# Patient Record
Sex: Male | Born: 1985 | Race: White | Hispanic: Yes | Marital: Married | Smoking: Never smoker
Health system: Southern US, Community
[De-identification: ages and names within clinical notes are randomized; demographics above are authoritative.]

---

## 2009-07-15 ENCOUNTER — Emergency Department (HOSPITAL_COMMUNITY): Admission: EM | Admit: 2009-07-15 | Discharge: 2009-07-15 | Payer: Self-pay | Admitting: Emergency Medicine

## 2010-12-27 ENCOUNTER — Emergency Department (HOSPITAL_COMMUNITY): Payer: Self-pay | Attending: Emergency Medicine

## 2010-12-27 ENCOUNTER — Emergency Department (HOSPITAL_COMMUNITY)
Admission: EM | Admit: 2010-12-27 | Discharge: 2010-12-27 | Disposition: A | Discharge status: Home / Self Care | Payer: Self-pay | Source: Home / Self Care | Attending: Emergency Medicine | Admitting: Emergency Medicine

## 2010-12-27 DIAGNOSIS — R079 Chest pain, unspecified: Secondary | ICD-10-CM | POA: Insufficient documentation

## 2010-12-27 DIAGNOSIS — R071 Chest pain on breathing: Secondary | ICD-10-CM | POA: Insufficient documentation

## 2011-08-10 IMAGING — CR DG CHEST 2V
2 series · 2 of 2 positions shown · non-contrast
Comparison: None.

CLINICAL DATA: Chest pain

CHEST - 2 VIEW

[w chest pa *]
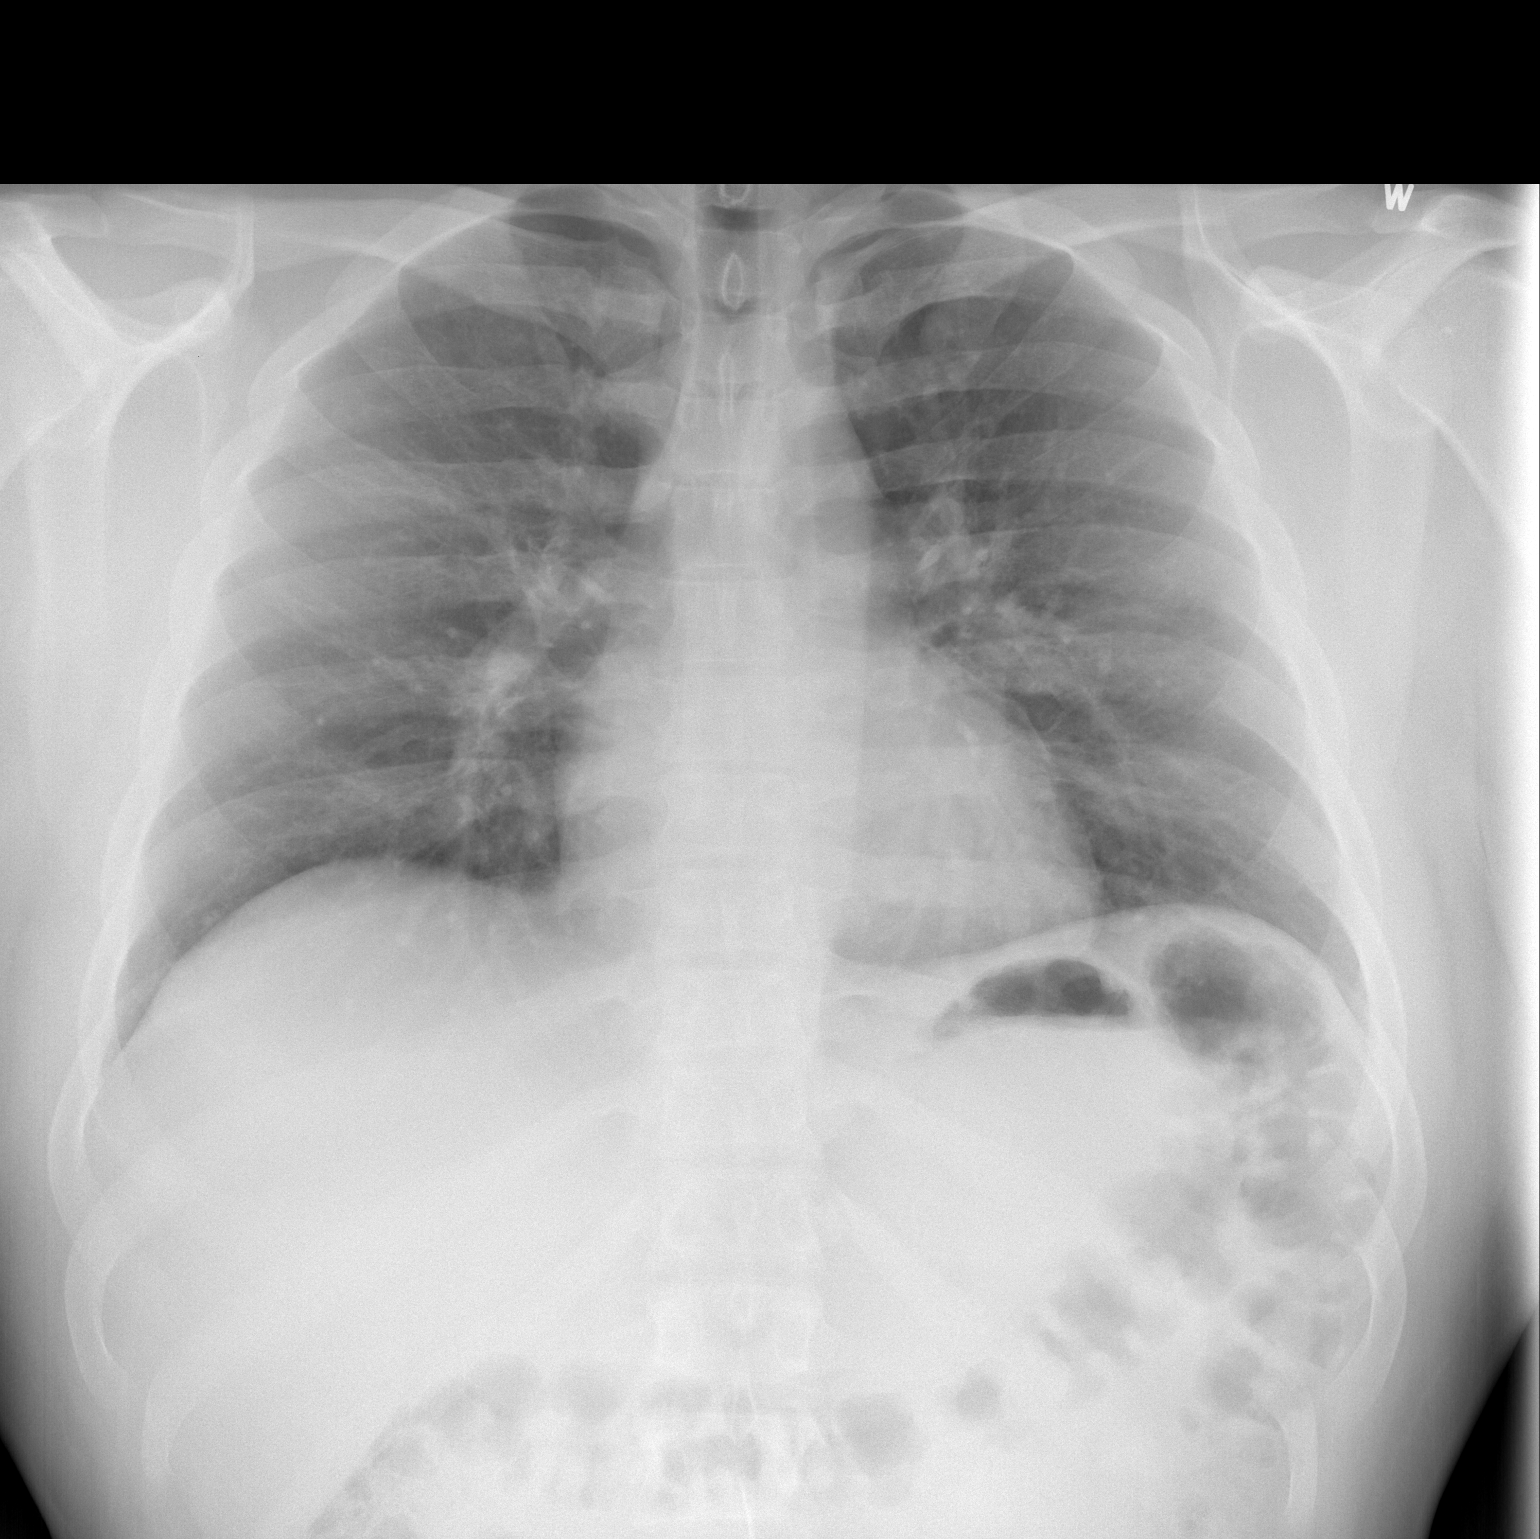

[w chest lat *]
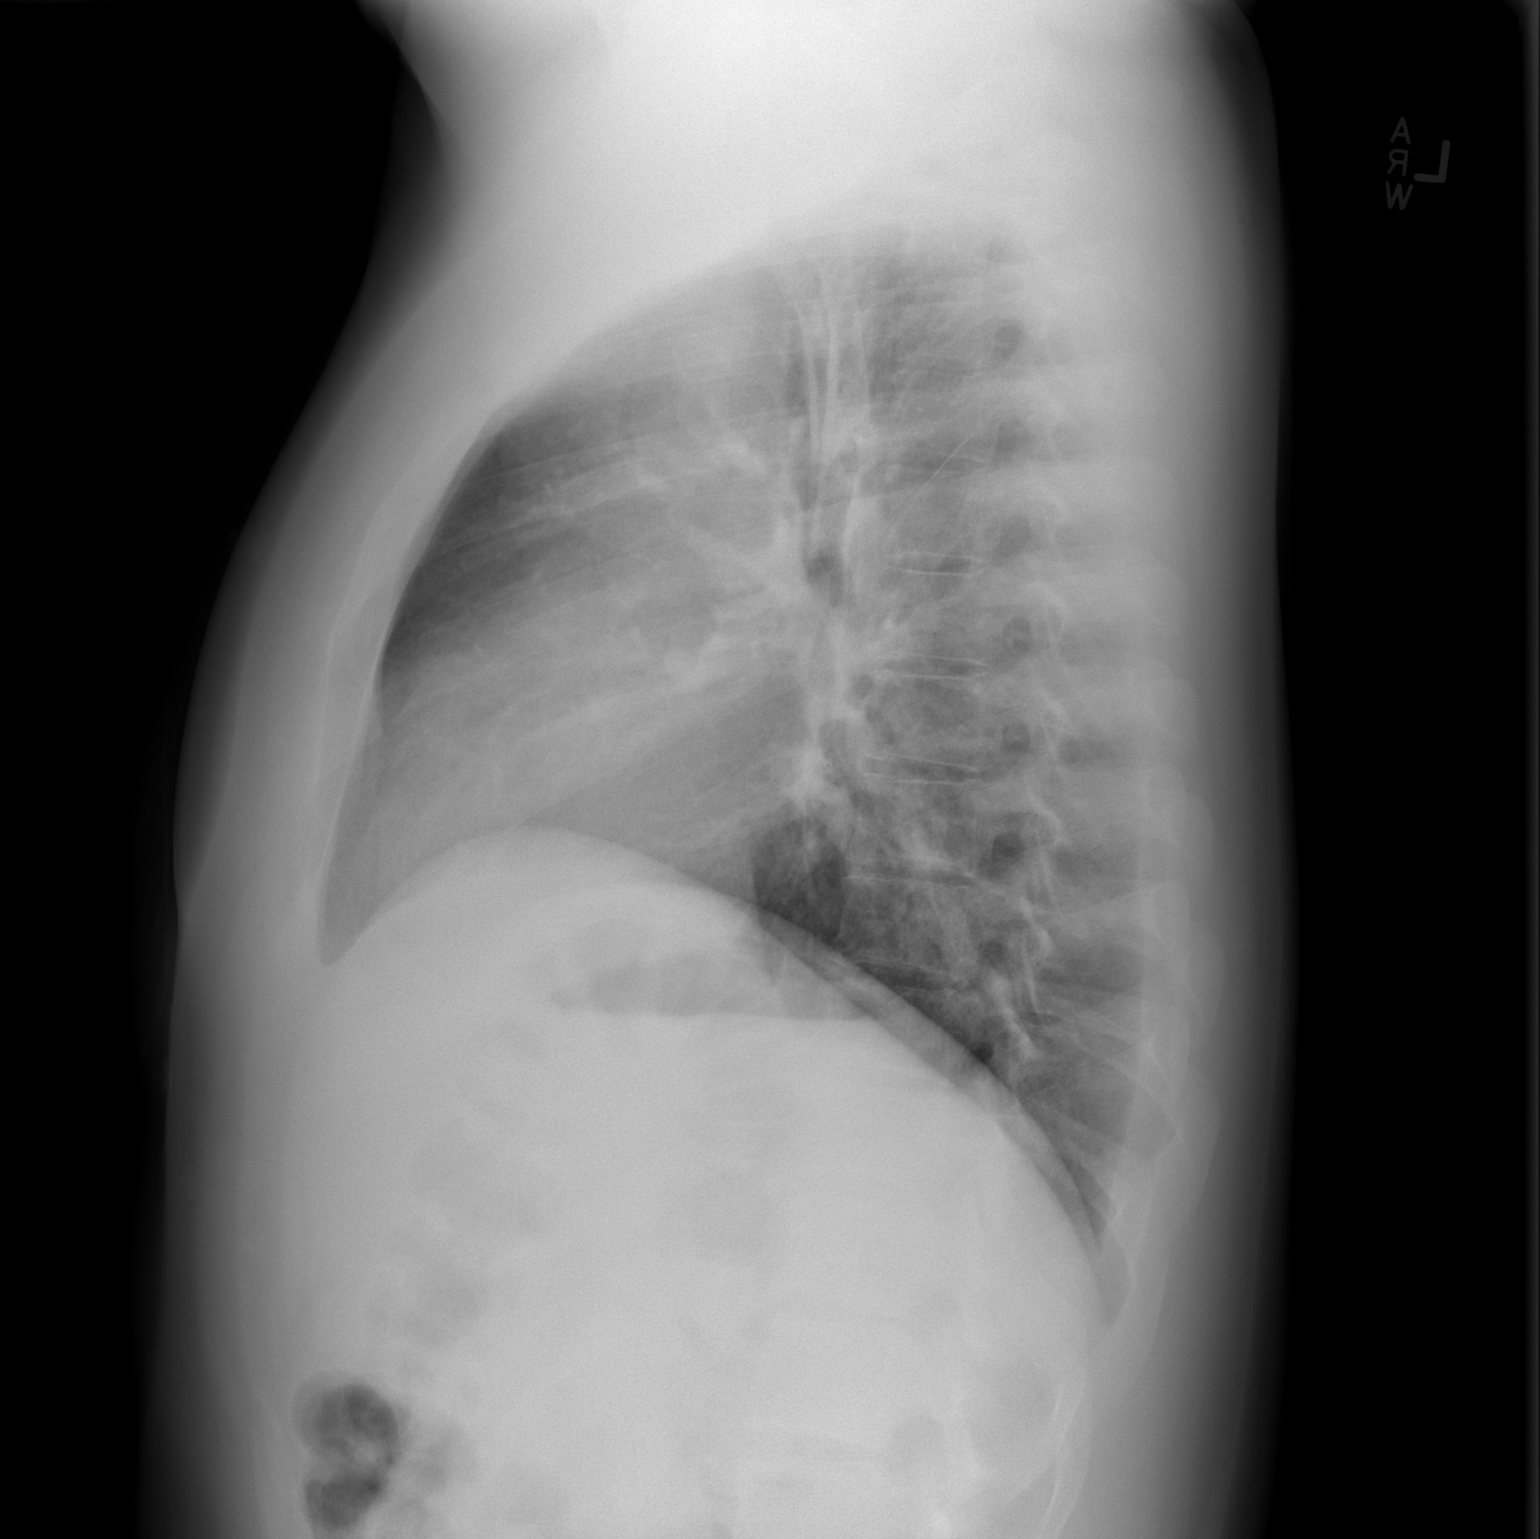

[2 of 2 positions shown; findings below may reference images not displayed]

FINDINGS: Heart, mediastinal, and hilar contours are within normal
limits.  The lungs are normally expanded and clear.  The bones are
unremarkable.  The trachea is midline.  Visualized upper abdomen
shows a nonobstructive bowel gas pattern.
IMPRESSION: Normal chest radiograph.

## 2020-12-24 ENCOUNTER — Encounter (HOSPITAL_BASED_OUTPATIENT_CLINIC_OR_DEPARTMENT_OTHER): Payer: Self-pay | Admitting: Emergency Medicine

## 2020-12-24 ENCOUNTER — Other Ambulatory Visit: Payer: Self-pay

## 2020-12-24 ENCOUNTER — Emergency Department (HOSPITAL_BASED_OUTPATIENT_CLINIC_OR_DEPARTMENT_OTHER)
Admission: EM | Admit: 2020-12-24 | Discharge: 2020-12-25 | Disposition: A | Discharge status: Home / Self Care | Payer: Self-pay | Attending: Emergency Medicine | Admitting: Emergency Medicine

## 2020-12-24 DIAGNOSIS — Z23 Encounter for immunization: Secondary | ICD-10-CM | POA: Insufficient documentation

## 2020-12-24 DIAGNOSIS — T24231A Burn of second degree of right lower leg, initial encounter: Secondary | ICD-10-CM | POA: Insufficient documentation

## 2020-12-24 DIAGNOSIS — Y990 Civilian activity done for income or pay: Secondary | ICD-10-CM | POA: Insufficient documentation

## 2020-12-24 DIAGNOSIS — T24232A Burn of second degree of left lower leg, initial encounter: Secondary | ICD-10-CM

## 2020-12-24 DIAGNOSIS — X110XXA Contact with hot water in bath or tub, initial encounter: Secondary | ICD-10-CM | POA: Insufficient documentation

## 2020-12-24 NOTE — ED Triage Notes (Signed)
Patient presents with wound to left lower extremity; states hot water spilled on leg while at work.

## 2020-12-25 MED ORDER — TETANUS-DIPHTH-ACELL PERTUSSIS 5-2.5-18.5 LF-MCG/0.5 IM SUSY
0.5000 mL | PREFILLED_SYRINGE | Freq: Once | INTRAMUSCULAR | Status: AC
  Administered 2020-12-25: 0.5 mL via INTRAMUSCULAR
  Filled 2020-12-25: qty 0.5

## 2020-12-25 MED ORDER — SILVER SULFADIAZINE 1 % EX CREA
TOPICAL_CREAM | Freq: Once | CUTANEOUS | Status: AC
  Administered 2020-12-25: 1 via TOPICAL
  Filled 2020-12-25: qty 85

## 2020-12-25 MED ORDER — SILVER SULFADIAZINE 1 % EX CREA: 1.0000 "application " | TOPICAL_CREAM | Freq: Every day | CUTANEOUS | 0 refills | Status: AC

## 2020-12-25 NOTE — ED Provider Notes (Signed)
[facility] EMERGENCY DEPARTMENT Provider Note   CSN: [id number] Arrival date & time: 12/24/20  2343   History Chief Complaint  Patient presents with  . Leg Injury    Samuel Collins is a 35 y.o. male.  The history is provided by the patient.  He suffered a burn to his left lower leg 3 days ago.  This occurred at work where a hot water spilled on his leg.  He comes in today because there has been some drainage and appears more red than it had been previously.  He denies fever or chills.  He does not know when his last tetanus immunization was, but thinks it has been a long time.  History reviewed. No pertinent past medical history.  There are no problems to display for this patient.   History reviewed. No pertinent surgical history.     History reviewed. No pertinent family history.  Social History   Tobacco Use  . Smoking status: Never Smoker  . Smokeless tobacco: Never Used  Substance Use Topics  . Alcohol use: Not Currently  . Drug use: Not Currently    Home Medications Prior to Admission medications   Not on File    Allergies    Patient has no known allergies.  Review of Systems   Review of Systems  All other systems reviewed and are negative.   Physical Exam Updated Vital Signs BP (!) 148/103 (BP Location: Left Arm)   Pulse 87   Temp 98.2 F (36.8 C) (Oral)   Ht 5\' 7"  (1.702 m)   Wt 131.5 kg   SpO2 99%   BMI 45.42 kg/m   Physical Exam Vitals and nursing note reviewed.   35 year old male, resting comfortably and in no acute distress. Vital signs are significant for elevated blood pressure. Oxygen saturation is 99%, which is normal. Head is normocephalic and atraumatic. PERRLA, EOMI. Oropharynx is clear. Neck is nontender and supple without adenopathy or JVD. Back is nontender and there is no CVA tenderness. Lungs are clear without rales, wheezes, or rhonchi. Chest is nontender. Heart has regular rate and rhythm without  murmur. Abdomen is soft, flat, nontender without masses or hepatosplenomegaly and peristalsis is normoactive. Extremities: Area of second-degree burn in the anterolateral aspect of the left lower leg.  Bullae have broken with some desquamation.  No devitalized areas in need of debridement.  No sign of infection.  Total body surface area involved estimated at 1-2%. Skin is warm and dry without rash. Neurologic: Mental status is normal, cranial nerves are intact, there are no motor or sensory deficits.   ED Results / Procedures / Treatments    Procedures .Burn Treatment  Date/Time: 12/25/2020 1:57 AM Performed by: 02/22/2021, MD Authorized by: Dione Booze, MD   Consent:    Consent obtained:  Verbal   Consent given by:  Patient   Risks, benefits, and alternatives were discussed: yes     Risks discussed:  Pain   Alternatives discussed:  No treatment Universal protocol:    Procedure explained and questions answered to patient or proxy's satisfaction: yes     Required blood products, implants, devices, and special equipment available: yes     Site/side marked: yes     Immediately prior to procedure, a time out was called: yes     Patient identity confirmed:  Verbally with patient and arm band Sedation:    Sedation type:  None Procedure details:    Total body burn percentage - partial/full:  2   Escharotomy performed: no   Burn area 1 details:    Burn depth:  Partial thickness (2nd)   Affected area:  Lower extremity   Lower extremity location:  L leg   Debridement performed: no     Wound treatment:  Silver sulfadiazine   Dressing:  Fine mesh gauze Post-procedure details:    Procedure completion:  Tolerated well, no immediate complications     Medications Ordered in ED Medications  Tdap (BOOSTRIX) injection 0.5 mL (has no administration in time range)  silver sulfADIAZINE (SILVADENE) 1 % cream (has no administration in time range)    ED Course  I have reviewed the triage  vital signs and the nursing notes.  MDM Rules/Calculators/A&P Second-degree burn of left lower leg.  No signs of infection currently.  He is given a prescription for silver sulfadiazine cream.  Tdap booster is given.  Old records were reviewed, and he has no relevant past visits, no record of prior tetanus immunization.  Final Clinical Impression(s) / ED Diagnoses Final diagnoses:  Second degree burn of left lower leg, initial encounter    Rx / DC Orders ED Discharge Orders         Ordered    silver sulfADIAZINE (SILVADENE) 1 % cream  Daily        12/25/20 0153           Dione Booze, MD 12/25/20 [phone]

## 2020-12-25 NOTE — ED Triage Notes (Signed)
States occurred 3 days ago.

## 2024-07-10 ENCOUNTER — Emergency Department (HOSPITAL_BASED_OUTPATIENT_CLINIC_OR_DEPARTMENT_OTHER)
Admission: EM | Admit: 2024-07-10 | Discharge: 2024-07-10 | Disposition: A | Discharge status: Home / Self Care | Payer: Self-pay | Source: Ambulatory Visit | Attending: Emergency Medicine | Admitting: Emergency Medicine

## 2024-07-10 ENCOUNTER — Emergency Department (HOSPITAL_BASED_OUTPATIENT_CLINIC_OR_DEPARTMENT_OTHER): Payer: Self-pay

## 2024-07-10 ENCOUNTER — Emergency Department (HOSPITAL_BASED_OUTPATIENT_CLINIC_OR_DEPARTMENT_OTHER): Payer: Self-pay | Admitting: Radiology

## 2024-07-10 ENCOUNTER — Other Ambulatory Visit: Payer: Self-pay

## 2024-07-10 DIAGNOSIS — I10 Essential (primary) hypertension: Secondary | ICD-10-CM | POA: Insufficient documentation

## 2024-07-10 DIAGNOSIS — U071 COVID-19: Secondary | ICD-10-CM | POA: Insufficient documentation

## 2024-07-10 DIAGNOSIS — R030 Elevated blood-pressure reading, without diagnosis of hypertension: Secondary | ICD-10-CM

## 2024-07-10 DIAGNOSIS — E876 Hypokalemia: Secondary | ICD-10-CM | POA: Insufficient documentation

## 2024-07-10 LAB — CBC WITH DIFFERENTIAL/PLATELET
Abs Immature Granulocytes: 0.01 K/uL (ref 0.00–0.07)
Basophils Absolute: 0 K/uL (ref 0.0–0.1)
Basophils Relative: 0 %
Eosinophils Absolute: 0.1 K/uL (ref 0.0–0.5)
Eosinophils Relative: 2 %
HCT: 39.9 % (ref 39.0–52.0)
Hemoglobin: 12.7 g/dL — ABNORMAL LOW (ref 13.0–17.0)
Immature Granulocytes: 0 %
Lymphocytes Relative: 28 %
Lymphs Abs: 1.4 K/uL (ref 0.7–4.0)
MCH: 25.9 pg — ABNORMAL LOW (ref 26.0–34.0)
MCHC: 31.8 g/dL (ref 30.0–36.0)
MCV: 81.3 fL (ref 80.0–100.0)
Monocytes Absolute: 0.5 K/uL (ref 0.1–1.0)
Monocytes Relative: 9 %
Neutro Abs: 3.1 K/uL (ref 1.7–7.7)
Neutrophils Relative %: 61 %
Platelets: 228 K/uL (ref 150–400)
RBC: 4.91 MIL/uL (ref 4.22–5.81)
RDW: 15.1 % (ref 11.5–15.5)
WBC: 5.1 K/uL (ref 4.0–10.5)
nRBC: 0 % (ref 0.0–0.2)

## 2024-07-10 LAB — BASIC METABOLIC PANEL WITH GFR
Anion gap: 13 (ref 5–15)
BUN: 14 mg/dL (ref 6–20)
CO2: 24 mmol/L (ref 22–32)
Calcium: 8.8 mg/dL — ABNORMAL LOW (ref 8.9–10.3)
Chloride: 104 mmol/L (ref 98–111)
Creatinine, Ser: 1.22 mg/dL (ref 0.61–1.24)
GFR, Estimated: >60 mL/min (ref >60)
Glucose, Bld: 105 mg/dL — ABNORMAL HIGH (ref 70–99)
Potassium: 3.4 mmol/L — ABNORMAL LOW (ref 3.5–5.1)
Sodium: 141 mmol/L (ref 135–145)

## 2024-07-10 MED ORDER — AMLODIPINE BESYLATE 10 MG PO TABS: 10.0000 mg | ORAL_TABLET | Freq: Every day | ORAL | 1 refills | Status: AC

## 2024-07-10 MED ORDER — IOHEXOL 350 MG/ML SOLN
100.0000 mL | Freq: Once | INTRAVENOUS | Status: AC | PRN
Start: 2024-07-10 — End: 2024-07-10
  Administered 2024-07-10: 100 mL via INTRAVENOUS

## 2024-07-10 NOTE — ED Triage Notes (Signed)
 Chest tightness x2 weeks-SOB with exertion. Cough starting this weekend. Feeling chills body aches. No SOB at rest. +COVID today.

## 2024-07-10 NOTE — ED Provider Notes (Addendum)
 [place] EMERGENCY DEPARTMENT AT [facility] Provider Note   CSN: [id number] Arrival date & time: 07/10/24  1454     Patient presents with: Shortness of Breath (COVID)   Samuel Collins is a 38 y.o. male.   Patient with flulike symptoms since Monday.  Increasing shortness of breath here lately particularly with exertion.  Has had a pretty significant cough.  Feeling chills body aches.  Temp here 97.9 heart rate 113 respirations 24 blood pressure 204/140.  No nausea vomiting or diarrhea.  Patient went to urgent care today and tested positive for COVID.  Sent here for further evaluation due to the shortness of breath.  Past medical history noncontributory.  Patient's blood pressure was high at urgent care and is high here no known history of hypertension.  But with it being this high it is possible it could be underlying hypertension exacerbated by the COVID illness.  Oxygen saturations here are actually all in the upper 90s.       Prior to Admission medications   Medication Sig Start Date End Date Taking? Authorizing Provider  silver  sulfADIAZINE  (SILVADENE ) 1 % cream Apply 1 application topically daily. 12/25/20   Samuel Lenis, MD    Allergies: Patient has no known allergies.    Review of Systems  Constitutional:  Positive for chills. Negative for fever.  HENT:  Negative for ear pain and sore throat.   Eyes:  Negative for pain and visual disturbance.  Respiratory:  Positive for shortness of breath. Negative for cough.   Cardiovascular:  Negative for chest pain and palpitations.  Gastrointestinal:  Negative for abdominal pain and vomiting.  Genitourinary:  Negative for dysuria and hematuria.  Musculoskeletal:  Positive for myalgias. Negative for arthralgias and back pain.  Skin:  Negative for color change and rash.  Neurological:  Negative for seizures and syncope.  All other systems reviewed and are negative.   Updated Vital Signs BP (!) 204/140 (BP Location:  Right Wrist)   Pulse (!) 113   Temp 97.9 F (36.6 C)   Resp (!) 24   SpO2 100%   Physical Exam Vitals and nursing note reviewed.  Constitutional:      General: He is not in acute distress.    Appearance: Normal appearance. He is well-developed.  HENT:     Head: Normocephalic and atraumatic.     Mouth/Throat:     Mouth: Mucous membranes are moist.  Eyes:     Extraocular Movements: Extraocular movements intact.     Conjunctiva/sclera: Conjunctivae normal.     Pupils: Pupils are equal, round, and reactive to light.  Cardiovascular:     Rate and Rhythm: Regular rhythm. Tachycardia present.     Heart sounds: No murmur heard. Pulmonary:     Effort: Pulmonary effort is normal. No respiratory distress.     Breath sounds: Normal breath sounds. No stridor. No wheezing, rhonchi or rales.  Abdominal:     Palpations: Abdomen is soft.     Tenderness: There is no abdominal tenderness.  Musculoskeletal:        General: No swelling.     Cervical back: Normal range of motion and neck supple.  Skin:    General: Skin is warm and dry.     Capillary Refill: Capillary refill takes less than 2 seconds.  Neurological:     General: No focal deficit present.     Mental Status: He is alert and oriented to person, place, and time.  Psychiatric:  Mood and Affect: Mood normal.     (all labs ordered are listed, but only abnormal results are displayed) Labs Reviewed  CBC WITH DIFFERENTIAL/PLATELET - Abnormal; Notable for the following components:      Result Value   Hemoglobin 12.7 (*)    MCH 25.9 (*)    All other components within normal limits  BASIC METABOLIC PANEL WITH GFR - Abnormal; Notable for the following components:   Potassium 3.4 (*)    Glucose, Bld 105 (*)    Calcium 8.8 (*)    All other components within normal limits    EKG: EKG Interpretation Date/Time:  Thursday July 10 2024 15:05:22 EDT Ventricular Rate:  114 PR Interval:  156 QRS Duration:  96 QT  Interval:  350 QTC Calculation: 482 R Axis:   117  Text Interpretation: Sinus tachycardia Left posterior fascicular block Abnormal ECG No previous ECGs available Confirmed by Melynda Krzywicki [phone]) on 07/10/2024 3:27:52 PM  Radiology: DG Chest 2 View Result Date: 07/10/2024 CLINICAL DATA:  Shortness of breath, chest pain EXAM: CHEST - 2 VIEW COMPARISON:  Chest radiograph December 27, 2010 FINDINGS: The heart size is enlarged. Diffuse reticular opacities throughout both lungs may represent vascular congestion versus interstitial edema, increased to prior. No significant pleural effusion. No acute osseous abnormality. IMPRESSION: Pulmonary vascular congestion versus edema /interstitial prominence. No pleural effusion. Cardiomegaly. Electronically Signed   By: Megan  Zare M.D.   On: 07/10/2024 15:55     Procedures   Medications Ordered in the ED - No data to display                                  Medical Decision Making Amount and/or Complexity of Data Reviewed Labs: ordered. Radiology: ordered.  Risk Prescription drug management.   Patient's oxygen saturations are good.  But he is a little tachypneic.  Little bit tachycardic.  Will get chest x-ray get basic labs.  Patient really out of the window for benefit from Paxlovid.  Will keep in mind possibility of blood clot.  Pulmonary embolus.  CBC white count 5.1 hemoglobin 12.7 platelets 228 patient metabolic panel potassium a little low at 3.4 renal function normal.  Electrolytes normal.  Two-view chest pulmonary vascular congestion versus edema interstitial prominence no pleural effusion.  Cardiomegaly.  With patient's renal function being normal we will go do CT angio chest that will give a better look at the x-ray for any developing pneumonia.  And I will rule out pulmonary embolus.  CT angio without evidence of any pulmonary embolus.  Show a lot interstitial changes and diffuse groundglass changes throughout both lungs.  Probably  secondary to the COVID.  Patient was ambulated he does feel short of breath but his oxygen sats stay in the upper 90s to 100% while walking around.  His blood pressure is elevated we will start him on some blood pressure medicine.  Precautions provided that for any new or worse breathing at all.  So recommended they get a finger pulse ox as well.  Will start him on amlodipine  they will work on getting a primary care doctor for follow-up.  Final diagnoses:  COVID  Elevated blood pressure reading    ED Discharge Orders     None          Geraldene Hamilton, MD 07/10/24 [id number]    Geraldene Hamilton, MD 07/10/24 1623    Geraldene Hamilton, MD 07/10/24 [phone]

## 2024-07-10 NOTE — Discharge Instructions (Signed)
 Watch carefully at home for any worsening breathing.  Your oxygen sats are remaining pretty good.  X-ray highly suggestive and CAT scan highly suggestive of COVID findings.  Also blood pressure very high here.  Will start amlodipine  for the blood pressure take daily.  Find a primary care doctor to follow-up with.

## 2024-09-12 ENCOUNTER — Ambulatory Visit: Payer: Self-pay | Admitting: Family Medicine
